# Patient Record
Sex: Male | Born: 1980 | Hispanic: Yes | Smoking: Never smoker
Health system: Southern US, Community
[De-identification: ages and names within clinical notes are randomized; demographics above are authoritative.]

## PROBLEM LIST (undated history)

## (undated) DIAGNOSIS — Z789 Other specified health status: Secondary | ICD-10-CM

## (undated) HISTORY — PX: APPENDECTOMY: SHX54

## (undated) HISTORY — PX: OTHER SURGICAL HISTORY: SHX169

## (undated) HISTORY — PX: TREATMENT FISTULA ANAL: SUR1390

---

## 2016-12-04 ENCOUNTER — Other Ambulatory Visit: Payer: Self-pay | Admitting: Occupational Medicine

## 2016-12-04 ENCOUNTER — Ambulatory Visit: Payer: Self-pay

## 2016-12-04 DIAGNOSIS — M79645 Pain in left finger(s): Secondary | ICD-10-CM

## 2016-12-18 NOTE — Progress Notes (Signed)
[employer] contacted for [employer]

## 2016-12-18 NOTE — H&P (Signed)
  Paul BoutonRicardo Compton is an 36 y.o. male.   Chief Complaint: CLOSED DISPLACED FRACTURE OF THE MIDDLE PHALANX OF THE LEFT RING FINGER  HPI: THE PATIENT IS A 36 Y/O RIGHT HAND DOMINANT MALE WHO INJURED HIS LEFT RING FINGER ABOUT 3 WEEKS AGO WHILE CLEANING A SMALL POT.  HE WAS SEEN AT THE [facility] AND WAS PUT INTO A FINGER SPLINT.  THE PATIENT WAS SEEN IN OUR OFFICE FOR FURTHER EVALUATION. DISCUSSED THE REASON AND RATIONALE FOR SURGERY AND THE NEED FOR INTERNAL FIXATION.  DISCUSSED THE SURGICAL PROCEDURE, INCLUDING THE RISKS VERSUS BENEFITS, AND THE POST-OPERATIVE RECOVERY.  THE PATIENT IS HERE TODAY FOR SURGERY.   No past medical history on file.  No past surgical history on file.  No family history on file. Social History:  has no tobacco, alcohol, and drug history on file.  Allergies: Allergies not on file  No medications prior to admission.    No results found for this or any previous visit (from the past 48 hour(s)). No results found.  ROS NO RECENT ILLNESSES OR HOSPITALIZATIONS  There were no vitals taken for this visit. Physical Exam  General Appearance:  Alert, cooperative, no distress, appears stated age  Head:  Normocephalic, without obvious abnormality, atraumatic  Eyes:  Pupils equal, conjunctiva/corneas clear,         Throat: Lips, mucosa, and tongue normal; teeth and gums normal  Neck: No visible masses     Lungs:   respirations unlabored  Chest Wall:  No tenderness or deformity  Heart:  Regular rate and rhythm,  Abdomen:   Soft, non-tender,         Extremities:   Pulses: 2+ and symmetric  Skin: Skin color, texture, turgor normal, no rashes or lesions     Neurologic: Normal    Assessment CLOSED DISPLACED FRACTURE OF THE MIDDLE PHALANX OF THE LEFT RING FINGER  Plan LEFT RING FINGER OPEN REDUCTION AND INTERNAL FIXATION  R/B/A DISCUSSED WITH PT IN OFFICE.  PT VOICED UNDERSTANDING OF PLAN CONSENT SIGNED DAY OF  SURGERY PT SEEN AND EXAMINED PRIOR TO OPERATIVE PROCEDURE/DAY OF SURGERY SITE MARKED. QUESTIONS ANSWERED WILL GO HOME FOLLOWING SURGERY  WE ARE PLANNING SURGERY FOR YOUR UPPER EXTREMITY. THE RISKS AND BENEFITS OF SURGERY INCLUDE BUT NOT LIMITED TO BLEEDING INFECTION, DAMAGE TO NEARBY NERVES ARTERIES TENDONS, FAILURE OF SURGERY TO ACCOMPLISH ITS INTENDED GOALS, PERSISTENT SYMPTOMS AND NEED FOR FURTHER SURGICAL INTERVENTION. WITH THIS IN MIND WE WILL PROCEED. I HAVE DISCUSSED WITH THE PATIENT THE PRE AND POSTOPERATIVE REGIMEN AND THE DOS AND DON'TS. PT VOICED UNDERSTANDING AND INFORMED CONSENT SIGNED.  Karma GreaserSamantha Bonham Barton 12/18/2016, 12:07 PM

## 2016-12-18 NOTE — Progress Notes (Signed)
Denies cardiac history denies malignant hyperthermia. Instructions only provided by [employer] number [phone]

## 2016-12-19 ENCOUNTER — Other Ambulatory Visit: Payer: Self-pay

## 2016-12-19 ENCOUNTER — Ambulatory Visit (HOSPITAL_COMMUNITY): Payer: Worker's Compensation | Admitting: Anesthesiology

## 2016-12-19 ENCOUNTER — Ambulatory Visit (HOSPITAL_COMMUNITY)
Admission: RE | Admit: 2016-12-19 | Discharge: 2016-12-19 | Disposition: A | Discharge status: Home / Self Care | Payer: Worker's Compensation | Source: Ambulatory Visit | Attending: Orthopedic Surgery | Admitting: Orthopedic Surgery

## 2016-12-19 ENCOUNTER — Encounter (HOSPITAL_COMMUNITY): Payer: Self-pay | Admitting: *Deleted

## 2016-12-19 ENCOUNTER — Encounter (HOSPITAL_COMMUNITY)
Admission: RE | Disposition: A | Discharge status: Home / Self Care | Payer: Self-pay | Source: Ambulatory Visit | Attending: Orthopedic Surgery

## 2016-12-19 DIAGNOSIS — S62625A Displaced fracture of medial phalanx of left ring finger, initial encounter for closed fracture: Secondary | ICD-10-CM | POA: Diagnosis not present

## 2016-12-19 DIAGNOSIS — X58XXXA Exposure to other specified factors, initial encounter: Secondary | ICD-10-CM | POA: Diagnosis not present

## 2016-12-19 HISTORY — DX: Other specified health status: Z78.9

## 2016-12-19 HISTORY — PX: OPEN REDUCTION INTERNAL FIXATION (ORIF) PROXIMAL PHALANX: SHX6235

## 2016-12-19 SURGERY — OPEN REDUCTION INTERNAL FIXATION (ORIF) PROXIMAL PHALANX
Anesthesia: General | Laterality: Left

## 2016-12-19 MED ORDER — LACTATED RINGERS IV SOLN
INTRAVENOUS | Status: DC
  Administered 2016-12-19: 14:00:00 via INTRAVENOUS

## 2016-12-19 MED ORDER — PROMETHAZINE HCL 25 MG/ML IJ SOLN: 6.2500 mg | INTRAMUSCULAR | Status: DC | PRN

## 2016-12-19 MED ORDER — MIDAZOLAM HCL 2 MG/2ML IJ SOLN
INTRAMUSCULAR | Status: AC
  Filled 2016-12-19: qty 2

## 2016-12-19 MED ORDER — CEFAZOLIN SODIUM-DEXTROSE 2-4 GM/100ML-% IV SOLN
2.0000 g | INTRAVENOUS | Status: AC
  Administered 2016-12-19: 2 g via INTRAVENOUS
  Filled 2016-12-19: qty 100

## 2016-12-19 MED ORDER — OXYCODONE-ACETAMINOPHEN 5-325 MG PO TABS: 1.0000 | ORAL_TABLET | Freq: Three times a day (TID) | ORAL | 0 refills | Status: AC

## 2016-12-19 MED ORDER — LACTATED RINGERS IV SOLN
INTRAVENOUS | Status: DC | PRN
  Administered 2016-12-19 (×2): via INTRAVENOUS

## 2016-12-19 MED ORDER — 0.9 % SODIUM CHLORIDE (POUR BTL) OPTIME
TOPICAL | Status: DC | PRN
  Administered 2016-12-19: 1000 mL

## 2016-12-19 MED ORDER — PROPOFOL 10 MG/ML IV BOLUS
INTRAVENOUS | Status: DC | PRN
  Administered 2016-12-19: 200 mg via INTRAVENOUS

## 2016-12-19 MED ORDER — FENTANYL CITRATE (PF) 250 MCG/5ML IJ SOLN
INTRAMUSCULAR | Status: AC
Start: 2016-12-19 — End: ?
  Filled 2016-12-19: qty 5

## 2016-12-19 MED ORDER — HYDROMORPHONE HCL 1 MG/ML IJ SOLN: 0.2500 mg | INTRAMUSCULAR | Status: DC | PRN

## 2016-12-19 MED ORDER — LIDOCAINE 2% (20 MG/ML) 5 ML SYRINGE
INTRAMUSCULAR | Status: DC | PRN
  Administered 2016-12-19: 100 mg via INTRAVENOUS

## 2016-12-19 MED ORDER — OXYCODONE-ACETAMINOPHEN 5-325 MG PO TABS
1.0000 | ORAL_TABLET | Freq: Once | ORAL | Status: AC
  Administered 2016-12-19: 1 via ORAL

## 2016-12-19 MED ORDER — BUPIVACAINE HCL (PF) 0.25 % IJ SOLN
INTRAMUSCULAR | Status: DC | PRN
  Administered 2016-12-19: 30 mL

## 2016-12-19 MED ORDER — BUPIVACAINE HCL (PF) 0.25 % IJ SOLN
INTRAMUSCULAR | Status: AC
  Filled 2016-12-19: qty 30

## 2016-12-19 MED ORDER — OXYCODONE-ACETAMINOPHEN 5-325 MG PO TABS
ORAL_TABLET | ORAL | Status: AC
  Filled 2016-12-19: qty 1

## 2016-12-19 MED ORDER — CHLORHEXIDINE GLUCONATE 4 % EX LIQD: 60.0000 mL | Freq: Once | CUTANEOUS | Status: DC

## 2016-12-19 MED ORDER — MIDAZOLAM HCL 2 MG/2ML IJ SOLN
INTRAMUSCULAR | Status: DC | PRN
  Administered 2016-12-19: 2 mg via INTRAVENOUS

## 2016-12-19 MED ORDER — PROPOFOL 10 MG/ML IV BOLUS
INTRAVENOUS | Status: AC
  Filled 2016-12-19: qty 20

## 2016-12-19 MED ORDER — DEXAMETHASONE SODIUM PHOSPHATE 10 MG/ML IJ SOLN
INTRAMUSCULAR | Status: DC | PRN
  Administered 2016-12-19: 10 mg via INTRAVENOUS

## 2016-12-19 MED ORDER — FENTANYL CITRATE (PF) 250 MCG/5ML IJ SOLN
INTRAMUSCULAR | Status: DC | PRN
  Administered 2016-12-19 (×2): 50 ug via INTRAVENOUS

## 2016-12-19 SURGICAL SUPPLY — 61 items
BANDAGE ACE 3X5.8 VEL STRL LF (GAUZE/BANDAGES/DRESSINGS) IMPLANT
BANDAGE ACE 4X5 VEL STRL LF (GAUZE/BANDAGES/DRESSINGS) IMPLANT
BIT DRILL 1.0 W/MINI QC (BIT) ×2 IMPLANT
BIT DRILL 1.0MM W/MINI QC (BIT) ×1
BNDG COHESIVE 1X5 TAN STRL LF (GAUZE/BANDAGES/DRESSINGS) ×3 IMPLANT
BNDG COHESIVE 2X5 WHT NS (GAUZE/BANDAGES/DRESSINGS) ×3 IMPLANT
BNDG CONFORM 2 STRL LF (GAUZE/BANDAGES/DRESSINGS) ×3 IMPLANT
BNDG ELASTIC 2X5.8 VLCR STR LF (GAUZE/BANDAGES/DRESSINGS) ×3 IMPLANT
BNDG ESMARK 4X9 LF (GAUZE/BANDAGES/DRESSINGS) ×3 IMPLANT
BNDG GAUZE ELAST 4 BULKY (GAUZE/BANDAGES/DRESSINGS) IMPLANT
CAP PIN ORTHO PINK (CAP) IMPLANT
CAP PIN PROTECTOR ORTHO WHT (CAP) IMPLANT
CORDS BIPOLAR (ELECTRODE) ×3 IMPLANT
COVER SURGICAL LIGHT HANDLE (MISCELLANEOUS) ×3 IMPLANT
CUFF TOURNIQUET SINGLE 18IN (TOURNIQUET CUFF) ×3 IMPLANT
CUFF TOURNIQUET SINGLE 24IN (TOURNIQUET CUFF) IMPLANT
DRAPE OEC MINIVIEW 54X84 (DRAPES) IMPLANT
DRAPE SURG 17X23 STRL (DRAPES) ×3 IMPLANT
DRSG ADAPTIC 3X8 NADH LF (GAUZE/BANDAGES/DRESSINGS) IMPLANT
DRSG EMULSION OIL 3X3 NADH (GAUZE/BANDAGES/DRESSINGS) ×3 IMPLANT
GAUZE SPONGE 2X2 8PLY STRL LF (GAUZE/BANDAGES/DRESSINGS) IMPLANT
GAUZE SPONGE 4X4 12PLY STRL (GAUZE/BANDAGES/DRESSINGS) IMPLANT
GAUZE SPONGE 4X4 12PLY STRL LF (GAUZE/BANDAGES/DRESSINGS) ×3 IMPLANT
GLOVE BIOGEL PI IND STRL 8.5 (GLOVE) ×1 IMPLANT
GLOVE BIOGEL PI INDICATOR 8.5 (GLOVE) ×2
GLOVE SURG ORTHO 8.0 STRL STRW (GLOVE) ×3 IMPLANT
GOWN STRL REUS W/ TWL LRG LVL3 (GOWN DISPOSABLE) ×2 IMPLANT
GOWN STRL REUS W/ TWL XL LVL3 (GOWN DISPOSABLE) ×1 IMPLANT
GOWN STRL REUS W/TWL LRG LVL3 (GOWN DISPOSABLE) ×4
GOWN STRL REUS W/TWL XL LVL3 (GOWN DISPOSABLE) ×2
K-WIRE SMTH SNGL TROCAR .028X4 (WIRE)
KIT BASIN OR (CUSTOM PROCEDURE TRAY) ×3 IMPLANT
KIT ROOM TURNOVER OR (KITS) ×3 IMPLANT
KWIRE SMTH SNGL TROCAR .028X4 (WIRE) IMPLANT
MANIFOLD NEPTUNE II (INSTRUMENTS) ×3 IMPLANT
NEEDLE HYPO 25GX1X1/2 BEV (NEEDLE) IMPLANT
NS IRRIG 1000ML POUR BTL (IV SOLUTION) ×3 IMPLANT
PACK ORTHO EXTREMITY (CUSTOM PROCEDURE TRAY) ×3 IMPLANT
PAD ARMBOARD 7.5X6 YLW CONV (MISCELLANEOUS) ×6 IMPLANT
PAD CAST 4YDX4 CTTN HI CHSV (CAST SUPPLIES) IMPLANT
PADDING CAST COTTON 4X4 STRL (CAST SUPPLIES)
PADDING UNDERCAST 2  STERILE (CAST SUPPLIES) ×3 IMPLANT
SCREW 1.3X11MM (Screw) ×2 IMPLANT
SCREW BN 11X1.3XNONLOCK HND (Screw) ×1 IMPLANT
SCREW NON-LOCK 1.3X12 (Screw) ×3 IMPLANT
SOAP 2 % CHG 4 OZ (WOUND CARE) ×3 IMPLANT
SPLINT FINGER (SOFTGOODS) ×3 IMPLANT
SPONGE GAUZE 2X2 STER 10/PKG (GAUZE/BANDAGES/DRESSINGS)
SUCTION FRAZIER HANDLE 10FR (MISCELLANEOUS)
SUCTION TUBE FRAZIER 10FR DISP (MISCELLANEOUS) IMPLANT
SUT ETHIBOND 4 0 TF (SUTURE) ×3 IMPLANT
SUT ETHILON 4 0 PS 2 18 (SUTURE) IMPLANT
SUT MERSILENE 4 0 P 3 (SUTURE) IMPLANT
SUT PROLENE 4 0 PS 2 18 (SUTURE) ×3 IMPLANT
SYR CONTROL 10ML LL (SYRINGE) IMPLANT
TOWEL OR 17X24 6PK STRL BLUE (TOWEL DISPOSABLE) ×3 IMPLANT
TOWEL OR 17X26 10 PK STRL BLUE (TOWEL DISPOSABLE) ×3 IMPLANT
TUBE CONNECTING 12'X1/4 (SUCTIONS)
TUBE CONNECTING 12X1/4 (SUCTIONS) IMPLANT
UNDERPAD 30X30 (UNDERPADS AND DIAPERS) ×3 IMPLANT
WATER STERILE IRR 1000ML POUR (IV SOLUTION) ×3 IMPLANT

## 2016-12-19 NOTE — Anesthesia Preprocedure Evaluation (Signed)
Anesthesia Evaluation  Patient identified by MRN, date of birth, ID band Patient awake    Reviewed: Allergy & Precautions, NPO status , Patient's Chart, lab work & pertinent test results  Airway Mallampati: II  TM Distance: >3 FB Neck ROM: Full    Dental no notable dental hx. (+) Dental Advisory Given   Pulmonary neg pulmonary ROS,    Pulmonary exam normal        Cardiovascular negative cardio ROS Normal cardiovascular exam     Neuro/Psych negative neurological ROS  negative psych ROS   GI/Hepatic negative GI ROS, Neg liver ROS,   Endo/Other  negative endocrine ROS  Renal/GU negative Renal ROS  negative genitourinary   Musculoskeletal   Abdominal   Peds negative pediatric ROS (+)  Hematology negative hematology ROS (+)   Anesthesia Other Findings   Reproductive/Obstetrics negative OB ROS                             Anesthesia Physical Anesthesia Plan  ASA: I  Anesthesia Plan: General   Post-op Pain Management:    Induction: Intravenous  PONV Risk Score and Plan: 2 and Ondansetron and Dexamethasone  Airway Management Planned: LMA  Additional Equipment:   Intra-op Plan:   Post-operative Plan: Extubation in OR  Informed Consent: I have reviewed the patients History and Physical, chart, labs and discussed the procedure including the risks, benefits and alternatives for the proposed anesthesia with the patient or authorized representative who has indicated his/her understanding and acceptance.   Dental advisory given  Plan Discussed with: Anesthesiologist  Anesthesia Plan Comments:         Anesthesia Quick Evaluation

## 2016-12-19 NOTE — Progress Notes (Signed)
Spanish interpreter at bedside.

## 2016-12-19 NOTE — Anesthesia Procedure Notes (Signed)
Procedure Name: LMA Insertion Date/Time: 12/19/2016 4:52 PM Performed by: Alvera NovelPike, Kristi Norment H, CRNA Pre-anesthesia Checklist: Patient identified, Emergency Drugs available, Suction available and Patient being monitored Patient Re-evaluated:Patient Re-evaluated prior to induction Oxygen Delivery Method: Circle System Utilized Preoxygenation: Pre-oxygenation with 100% oxygen Induction Type: IV induction Ventilation: Mask ventilation without difficulty LMA: LMA inserted LMA Size: 5.0 Number of attempts: 1 Airway Equipment and Method: Bite block Placement Confirmation: positive ETCO2 Tube secured with: Tape Dental Injury: Teeth and Oropharynx as per pre-operative assessment

## 2016-12-19 NOTE — Discharge Instructions (Signed)
KEEP BANDAGE CLEAN AND DRY CALL OFFICE FOR F/U APPT [phone] NO RETURN TO WORK UNTIL FIRST POST OP APPT PT HAD SURGERY USE THIS AS WORK NOTE KEEP HAND ELEVATED ABOVE HEART OK TO APPLY ICE TO OPERATIVE AREA CONTACT OFFICE IF ANY WORSENING PAIN OR CONCERNS.

## 2016-12-19 NOTE — Progress Notes (Signed)
Paul Compton, Spanish Interpreter used for preoperative interview. Offered to send money to security and patient refused. Explained that we could not be responsible for same verbalized understanding.

## 2016-12-19 NOTE — Transfer of Care (Signed)
Immediate Anesthesia Transfer of Care Note  Patient: Paul Compton  Procedure(s) Performed: OPEN REDUCTION INTERNAL FIXATION (ORIF) RING PHALANX (Left )  Patient Location: PACU  Anesthesia Type:General  Level of Consciousness: drowsy  Airway & Oxygen Therapy: Patient Spontanous Breathing  Post-op Assessment: Report given to RN and Post -op Vital signs reviewed and stable  Post vital signs: Reviewed and stable  Last Vitals:  Vitals:   12/19/16 1351 12/19/16 1820  BP: 131/77 111/75  Pulse: 74 92  Resp: 17 12  Temp: 36.6 C (!) 36.2 C  SpO2: 100% 100%    Last Pain:  Vitals:   12/19/16 1820  TempSrc:   PainSc: (P) Asleep      Patients Stated Pain Goal: 6 (12/19/16 1407)  Complications: No apparent anesthesia complications

## 2016-12-19 NOTE — Op Note (Signed)
PREOPERATIVE DIAGNOSIS: Left ring finger middle phalanx malunion  POSTOPERATIVE DIAGNOSIS: Same  ATTENDING SURGEON: Dr. Gilman SchmidtFred Ortman who was scrubbed and present for the entire procedure  ASSISTANT SURGEON: None  ANESTHESIA: Gen. via LMA  OPERATIVE PROCEDURE: #1: Repair of left ring finger middle phalanx malunion #2. Radiographs 3 views left ring finger  IMPLANTS: 2 1.3 mm screws from the [employer] hand fracture system  RADIOGRAPHIC INTERPRETATION: AP lateral oblique views of the finger do show the well aligned middle phalanx with the screw fixation in place  SURGICAL INDICATIONS: The patient is a right-hand-dominant gentleman who sustained a closed injury to his left ring finger. Patient was seen and evaluated in the office and recommended undergo the above procedure. Risks benefits and alternatives were discussed in detail with the patient in a signed informed consent was obtained. Risks include but not limited to bleeding infection damage to nearby nerves arteries or tendons loss of motion of the wrists and digits incomplete relief of symptoms nonunion malunion malrotation and need for further surgical intervention  SURGICAL TECHNIQUE: Patient is properly identified in the preoperative holding area and a mark with a permanent marker made on the left ring finger to indicate the correct operative site. Patient brought back to operating room placed supine on anesthesia and table where general anesthesia was administered. Patient tolerated this well. A well-padded tourniquet was then placed on the left brachium and sealed with the appropriate drape. Left upper extremity was then prepped and draped in normal sterile fashion. Timeout was called cracks I was identified and the procedure then begun. A longitudinal incision made directly over the ring finger middle phalanx. Dissection was then carried down through the skin and subcutaneous tissue. Going between the interval of the lateral band the extensor  tendon this plane was then developed all the way down to bone. The patient did have the nearly healed middle phalanx fracture that was malunited. Takedown of the malunion was then carried out with small curettes and small instrumentation to re-create the fracture site. After takedown of the malunion reduction clamps were then used to hold the reduction. Following this 2 1.3 mm screws were then placed across the comminuted fracture site with good purchase and good rotational alignment. The wound was then thoroughly irrigated. The extensor interval was then closed with Ethibond suture. The skin was then closed with simple Prolene suture. Adaptic dressing and a sterile compressive bandage then applied. The patient is then placed in a finger splint and extubated taken recovery room in good condition.    POSTOPERATIVE PLAN: Patient be discharged to home seen back in the office in approximately 12 days wound check sutures out x-rays down to see her therapist for a finger splint and a postoperative ORIF of the middle phalanx with screw protocol.

## 2016-12-20 ENCOUNTER — Encounter (HOSPITAL_COMMUNITY): Payer: Self-pay | Admitting: Orthopedic Surgery

## 2017-01-02 NOTE — Anesthesia Postprocedure Evaluation (Signed)
Anesthesia Post Note  Patient: Paul Compton  Procedure(s) Performed: OPEN REDUCTION INTERNAL FIXATION (ORIF) RING PHALANX (Left )     Patient location during evaluation: PACU Anesthesia Type: General Level of consciousness: awake, awake and alert and oriented Pain management: pain level controlled Vital Signs Assessment: post-procedure vital signs reviewed and stable Respiratory status: spontaneous breathing, nonlabored ventilation and respiratory function stable Cardiovascular status: blood pressure returned to baseline Anesthetic complications: no    Last Vitals:  Vitals:   12/19/16 1835 12/19/16 1850  BP: 114/79 120/76  Pulse: 78 72  Resp: 16 14  Temp:  (!) 36.4 C  SpO2: 100% 97%    Last Pain:  Vitals:   12/19/16 1850  TempSrc:   PainSc: 4                  Merissa Renwick COKER

## 2018-04-09 ENCOUNTER — Ambulatory Visit: Payer: Self-pay

## 2018-04-09 ENCOUNTER — Other Ambulatory Visit: Payer: Self-pay | Admitting: Family Medicine

## 2018-04-09 DIAGNOSIS — M79644 Pain in right finger(s): Secondary | ICD-10-CM

## 2020-12-27 IMAGING — DX RIGHT RING FINGER 2+V
3 series · 3 of 3 positions shown · non-contrast
Comparison: None.

CLINICAL DATA: Pain after trauma

EXAM:
RIGHT RING FINGER 2+V

[finger pa]
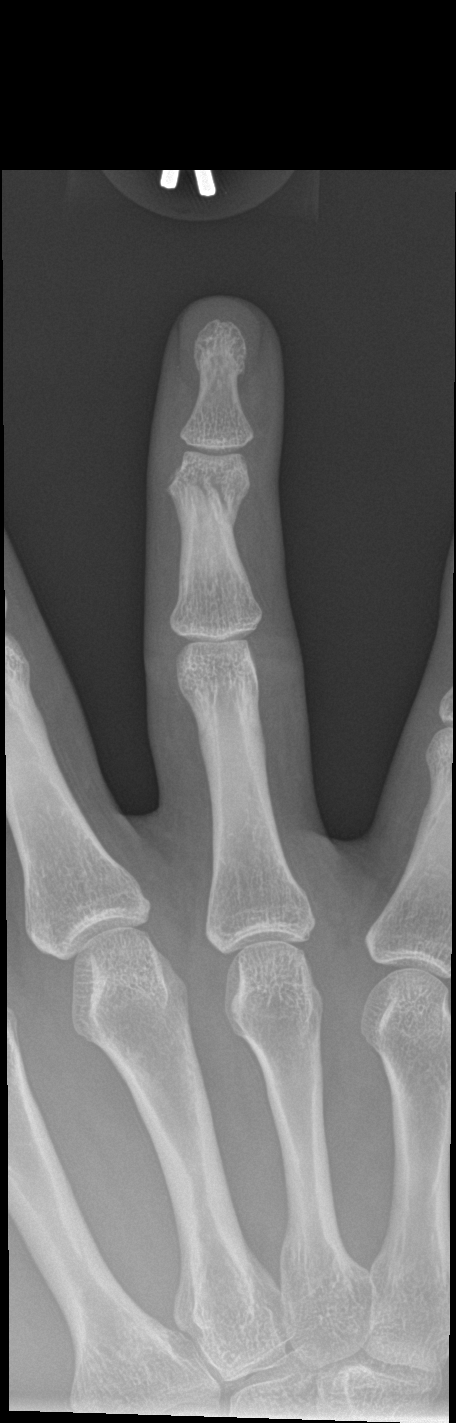

[finger obl]
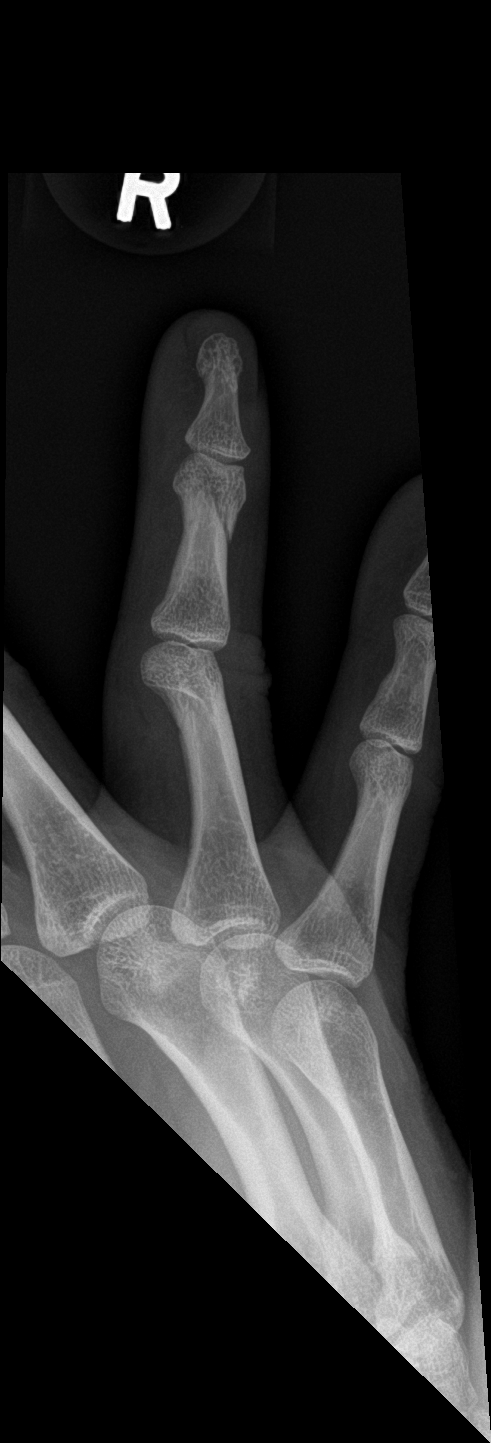

[finger lat]
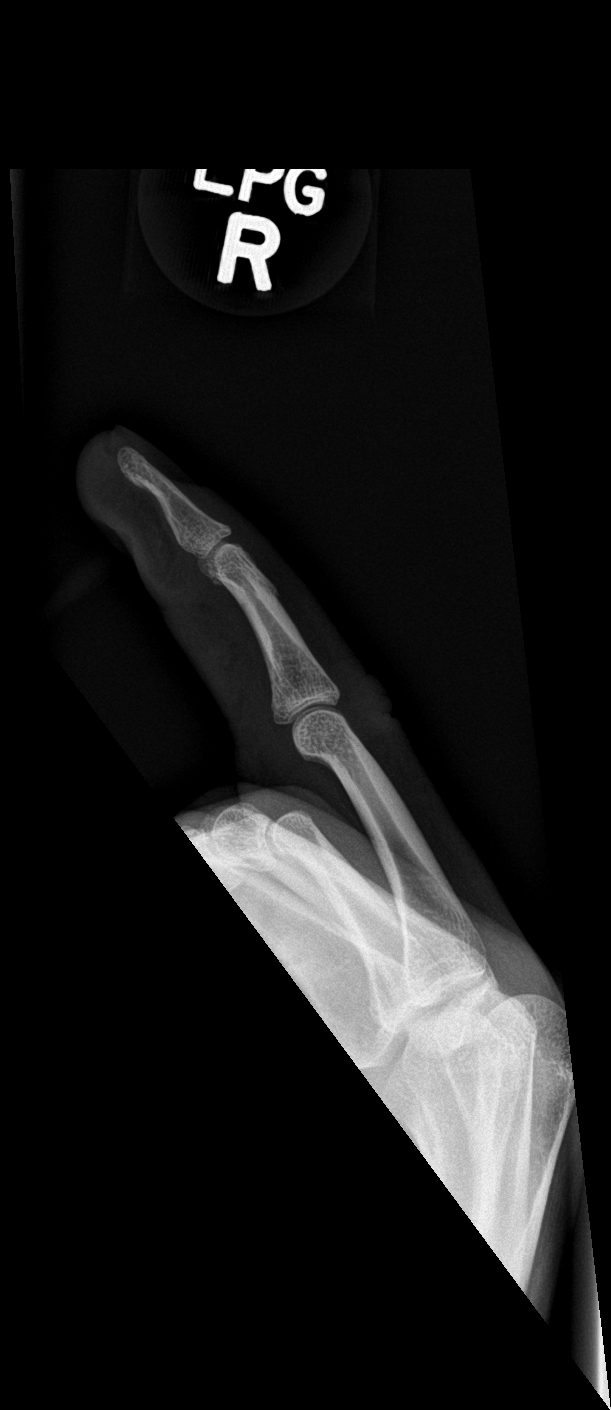

[3 of 3 positions shown; findings below may reference images not displayed]

FINDINGS: There is a fracture through the middle fourth phalanx.
IMPRESSION: Mildly displaced fracture through the middle fourth phalanx.
